# Patient Record
Sex: Female | Born: 1980 | Race: Black or African American | Hispanic: No | Marital: Married | Smoking: Never smoker
Health system: Southern US, Community
[De-identification: ages and names within clinical notes are randomized; demographics above are authoritative.]

## PROBLEM LIST (undated history)

## (undated) DIAGNOSIS — F419 Anxiety disorder, unspecified: Secondary | ICD-10-CM

## (undated) DIAGNOSIS — E039 Hypothyroidism, unspecified: Secondary | ICD-10-CM

## (undated) DIAGNOSIS — N840 Polyp of corpus uteri: Secondary | ICD-10-CM

## (undated) DIAGNOSIS — M1990 Unspecified osteoarthritis, unspecified site: Secondary | ICD-10-CM

## (undated) DIAGNOSIS — R011 Cardiac murmur, unspecified: Secondary | ICD-10-CM

## (undated) DIAGNOSIS — R51 Headache: Secondary | ICD-10-CM

## (undated) DIAGNOSIS — K8020 Calculus of gallbladder without cholecystitis without obstruction: Secondary | ICD-10-CM

## (undated) DIAGNOSIS — E119 Type 2 diabetes mellitus without complications: Secondary | ICD-10-CM

## (undated) DIAGNOSIS — R519 Headache, unspecified: Secondary | ICD-10-CM

## (undated) HISTORY — DX: Unspecified osteoarthritis, unspecified site: M19.90

## (undated) HISTORY — DX: Type 2 diabetes mellitus without complications: E11.9

## (undated) HISTORY — DX: Anxiety disorder, unspecified: F41.9

## (undated) HISTORY — DX: Headache, unspecified: R51.9

## (undated) HISTORY — DX: Calculus of gallbladder without cholecystitis without obstruction: K80.20

## (undated) HISTORY — DX: Headache: R51

## (undated) HISTORY — PX: POLYPECTOMY: SHX149

---

## 2003-11-24 ENCOUNTER — Other Ambulatory Visit: Admission: RE | Admit: 2003-11-24 | Discharge: 2003-11-24 | Payer: Self-pay | Admitting: Obstetrics and Gynecology

## 2004-02-22 HISTORY — PX: WISDOM TOOTH EXTRACTION: SHX21

## 2005-05-03 ENCOUNTER — Other Ambulatory Visit: Admission: RE | Admit: 2005-05-03 | Discharge: 2005-05-03 | Payer: Self-pay | Admitting: Obstetrics and Gynecology

## 2008-11-14 ENCOUNTER — Encounter: Admission: RE | Admit: 2008-11-14 | Discharge: 2008-11-14 | Payer: Self-pay | Admitting: Endocrinology

## 2010-08-24 IMAGING — US US SOFT TISSUE HEAD/NECK
1 series · 14 of 25 positions shown · non-contrast
Comparison: None.

CLINICAL DATA: Hypothyroidism for which the patient is undergoing
medical therapy.  Possible palpable nodule on physical examination.

THYROID ULTRASOUND 11/14/2008:
TECHNIQUE: Ultrasound examination of the thyroid gland and
adjacent soft tissues was performed.

[Series 1: us soft tissue head/neck · 0.08mm/px · 14 of 42 slices shown]
[im 1/42]
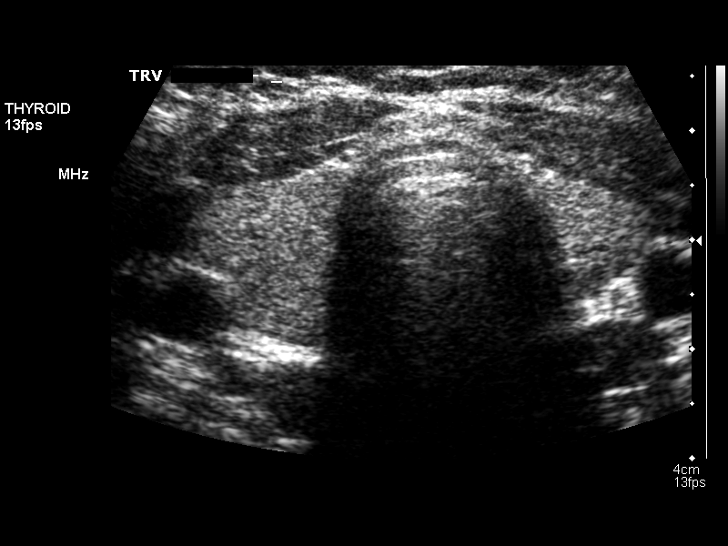
[im 4/42]
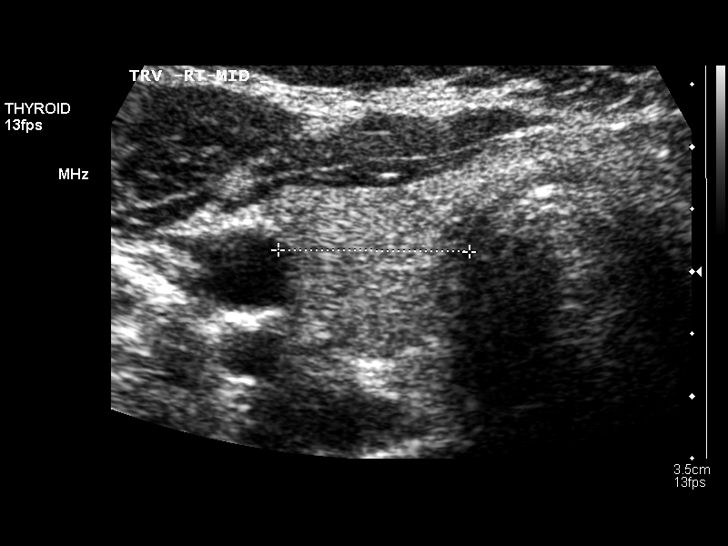
[im 7/42]
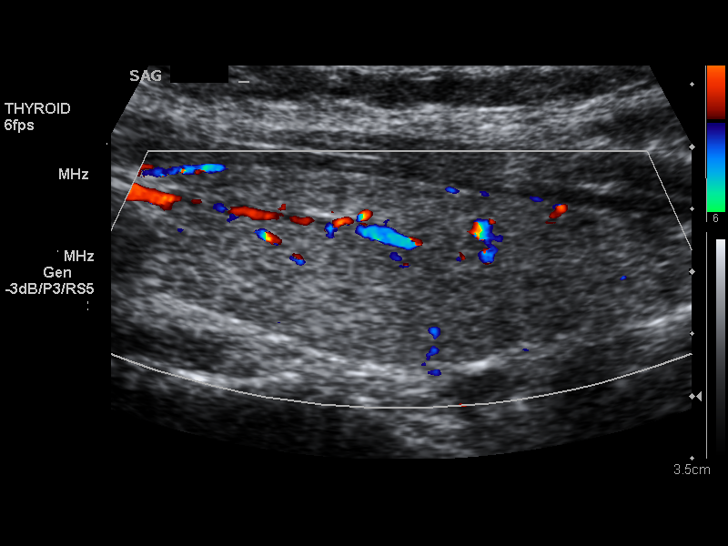
[im 11/42]
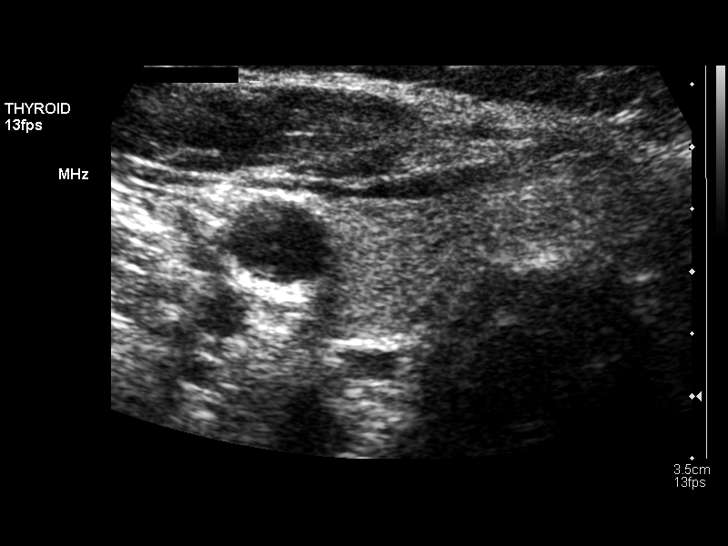
[im 14/42]
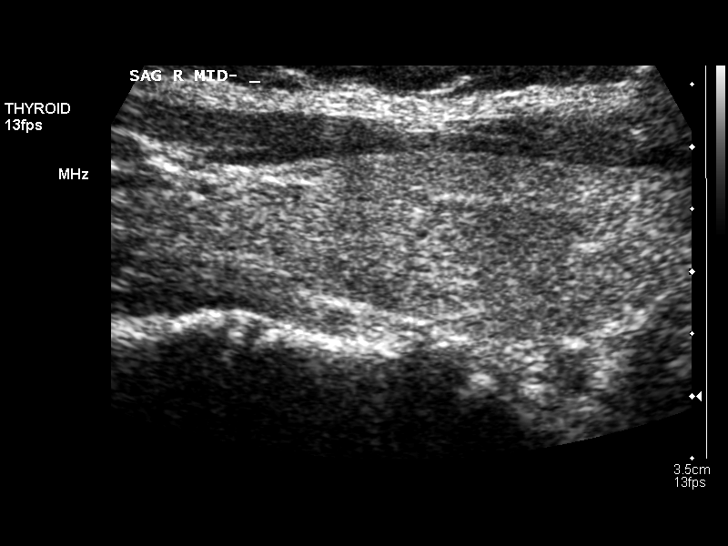
[im 16/42]
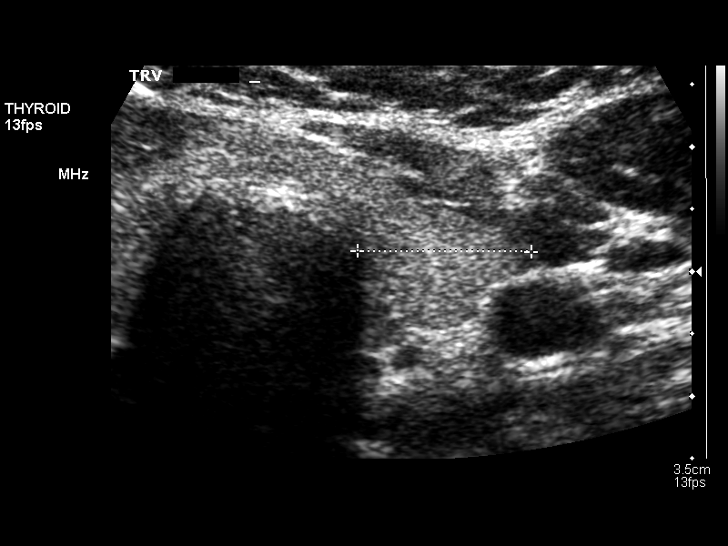
[im 19/42]
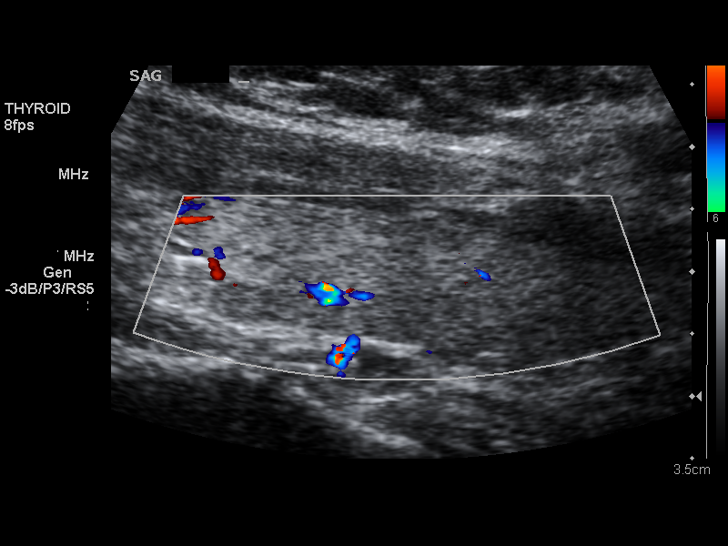
[im 23/42]
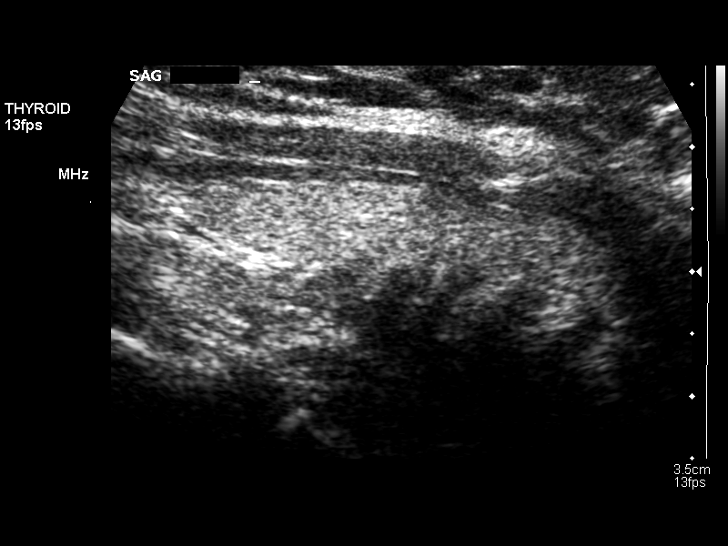
[im 26/42]
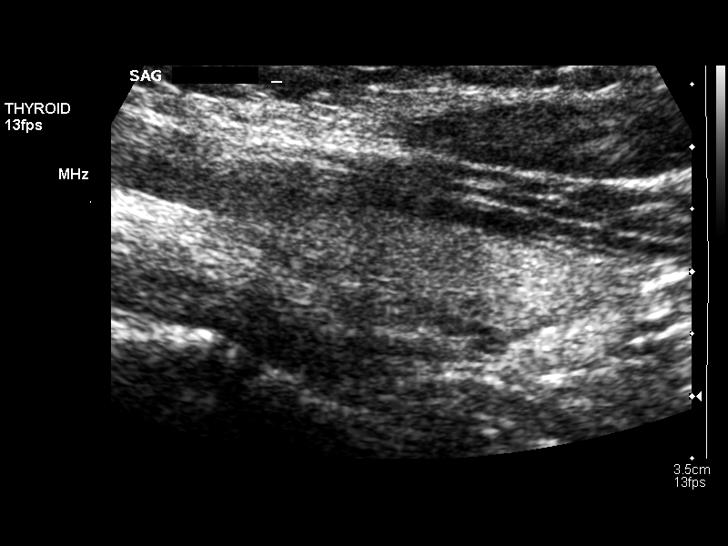
[im 28/42]
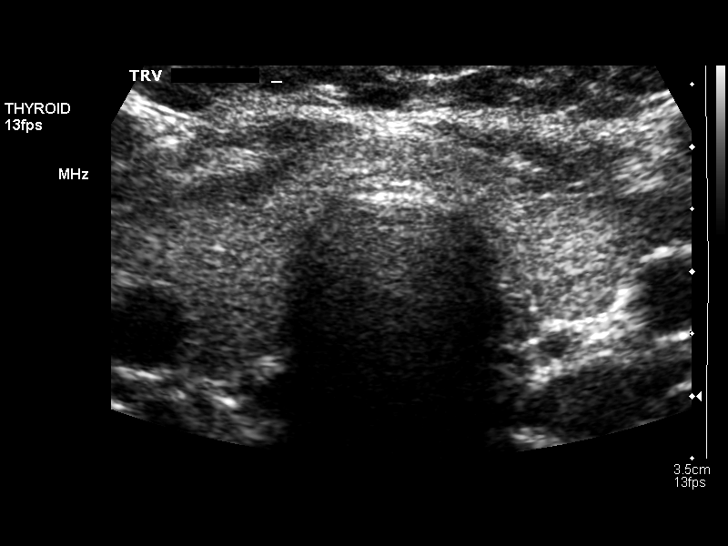
[im 31/42]
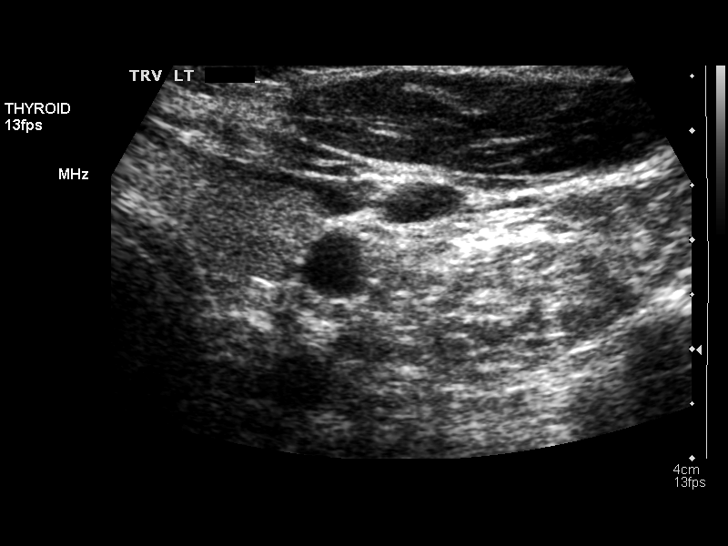
[im 35/42]
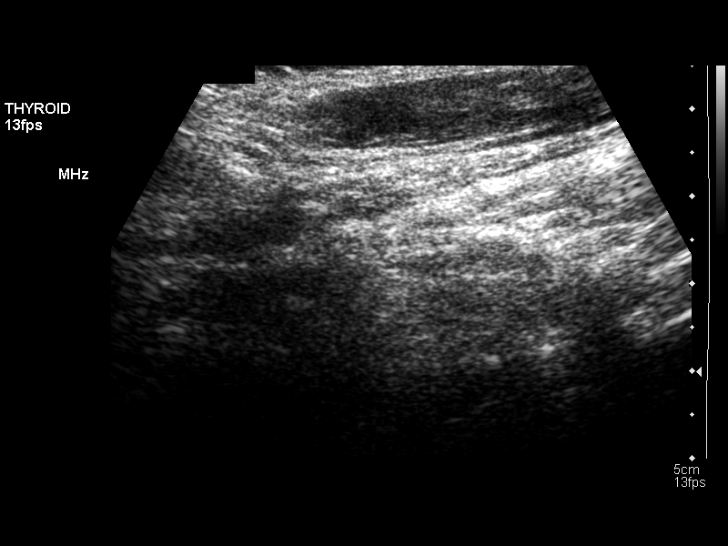
[im 38/42]
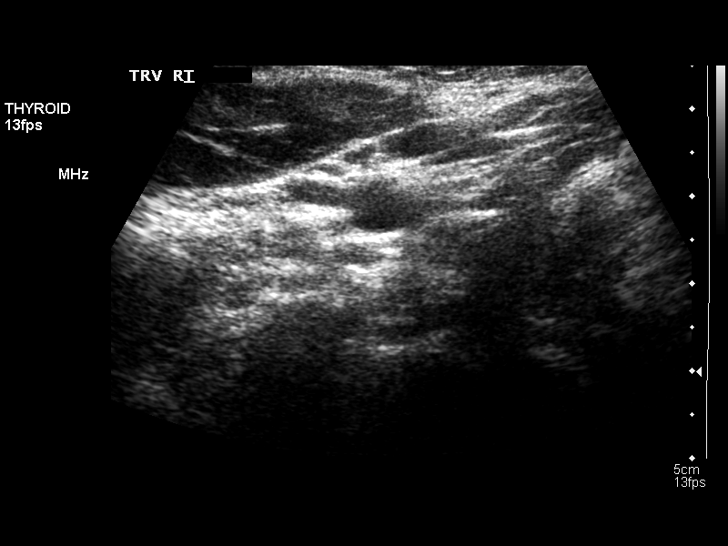
[im 42/42]
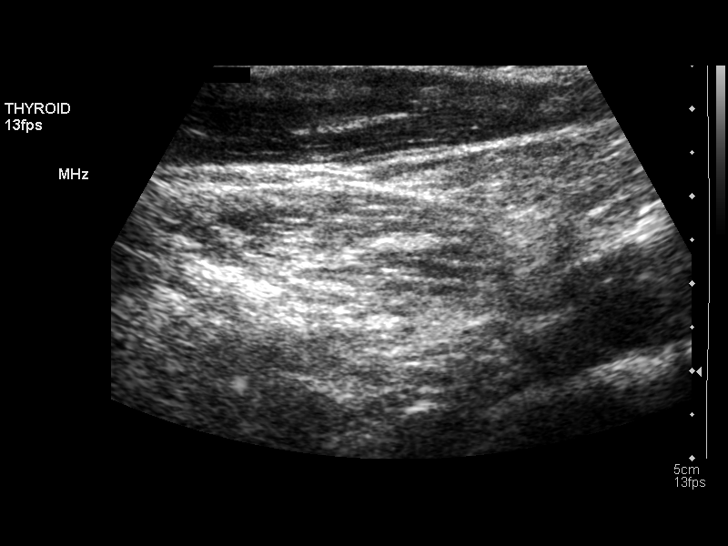

[14 of 25 positions shown; findings below may reference images not displayed]

FINDINGS: Asymmetric slight enlargement of the right lobe relative
to the left lobe.  The right lobe measures approximately 5.7 x
x 1.5 cm and the left lobe measures approximately 4.4 x 1.1 x
cm.  Slight enlargement of the isthmus up to 0.5 cm.  Heterogeneous
thyroid echotexture diffusely.  No cystic or solid nodules in the
thyroid gland.  No visible adjacent lymphadenopathy.
IMPRESSION: Asymmetric enlargement the right lobe of the thyroid gland,
measurements given above.  Heterogeneous thyroid echotexture
without evidence of cystic or solid nodules.

## 2014-04-28 ENCOUNTER — Encounter (HOSPITAL_BASED_OUTPATIENT_CLINIC_OR_DEPARTMENT_OTHER): Payer: Self-pay | Admitting: *Deleted

## 2014-04-28 NOTE — Progress Notes (Signed)
NPO AFTER MN WITH EXCEPTION CLEAR LIQUIDS UNTIL 0700 (NO CREAM/ MILK PRODUCTS).  ARRIVE AT 1130. NEEDS HG AND URINE PREG.  PRE-OP ORDERS PENDING.

## 2014-04-29 NOTE — H&P (Signed)
34 year old G 0 with 2.1 cm endometrial polyp. Last period lasted for one month.  Past Medical History  Diagnosis Date  . Endometrial polyp   . Heart murmur     benign, asymptomatic  . Hypothyroidism    Past Surgical History  Procedure Laterality Date  . Wisdom tooth extraction  2006   Review of patient's allergies indicates no known allergies. No results found for this or any previous visit (from the past 24 hour(s)). History   Social History  . Marital Status: Single    Spouse Name: N/A  . Number of Children: N/A  . Years of Education: N/A   Social History Main Topics  . Smoking status: Never Smoker   . Smokeless tobacco: Never Used  . Alcohol Use: Yes     Comment: occasional  . Drug Use: No  . Sexual Activity: Not on file   Other Topics Concern  . None   Social History Narrative  . None   History reviewed. No pertinent family history. General alert and oriented Lung CTAB Car RRR Abdomen is soft and non tender Pelvic above  IMPRESSION: Endometrial polyp Menorrhagia  PLAN: D and C, Hysteroscopy Risks reviewed  Consent signed

## 2014-04-30 ENCOUNTER — Ambulatory Visit (HOSPITAL_BASED_OUTPATIENT_CLINIC_OR_DEPARTMENT_OTHER): Payer: 59 | Admitting: Anesthesiology

## 2014-04-30 ENCOUNTER — Ambulatory Visit (HOSPITAL_BASED_OUTPATIENT_CLINIC_OR_DEPARTMENT_OTHER)
Admission: RE | Admit: 2014-04-30 | Discharge: 2014-04-30 | Disposition: A | Discharge status: Home / Self Care | Payer: 59 | Source: Ambulatory Visit | Attending: Obstetrics and Gynecology | Admitting: Obstetrics and Gynecology

## 2014-04-30 ENCOUNTER — Encounter (HOSPITAL_BASED_OUTPATIENT_CLINIC_OR_DEPARTMENT_OTHER): Payer: Self-pay | Admitting: Anesthesiology

## 2014-04-30 ENCOUNTER — Encounter (HOSPITAL_BASED_OUTPATIENT_CLINIC_OR_DEPARTMENT_OTHER)
Admission: RE | Disposition: A | Discharge status: Home / Self Care | Payer: Self-pay | Source: Ambulatory Visit | Attending: Obstetrics and Gynecology

## 2014-04-30 DIAGNOSIS — N920 Excessive and frequent menstruation with regular cycle: Secondary | ICD-10-CM | POA: Diagnosis present

## 2014-04-30 DIAGNOSIS — D250 Submucous leiomyoma of uterus: Secondary | ICD-10-CM | POA: Diagnosis not present

## 2014-04-30 DIAGNOSIS — N840 Polyp of corpus uteri: Secondary | ICD-10-CM | POA: Diagnosis present

## 2014-04-30 DIAGNOSIS — E039 Hypothyroidism, unspecified: Secondary | ICD-10-CM | POA: Diagnosis not present

## 2014-04-30 DIAGNOSIS — Z6841 Body Mass Index (BMI) 40.0 and over, adult: Secondary | ICD-10-CM | POA: Insufficient documentation

## 2014-04-30 HISTORY — PX: HYSTEROSCOPY WITH D & C: SHX1775

## 2014-04-30 HISTORY — DX: Hypothyroidism, unspecified: E03.9

## 2014-04-30 HISTORY — DX: Polyp of corpus uteri: N84.0

## 2014-04-30 HISTORY — DX: Cardiac murmur, unspecified: R01.1

## 2014-04-30 LAB — CBC
HCT: 40.1 % (ref 36.0–46.0)
Hemoglobin: 13.4 g/dL (ref 12.0–15.0)
MCH: 28.6 pg (ref 26.0–34.0)
MCHC: 33.4 g/dL (ref 30.0–36.0)
MCV: 85.5 fL (ref 78.0–100.0)
Platelets: 418 10*3/uL — ABNORMAL HIGH (ref 150–400)
RBC: 4.69 MIL/uL (ref 3.87–5.11)
RDW: 12.7 % (ref 11.5–15.5)
WBC: 11.3 10*3/uL — AB (ref 4.0–10.5)

## 2014-04-30 LAB — POCT PREGNANCY, URINE: PREG TEST UR: NEGATIVE

## 2014-04-30 SURGERY — DILATATION AND CURETTAGE /HYSTEROSCOPY
Anesthesia: General | Site: Uterus

## 2014-04-30 MED ORDER — GLYCINE 1.5 % IR SOLN
Status: DC | PRN
  Administered 2014-04-30: 3000 mL

## 2014-04-30 MED ORDER — FENTANYL CITRATE 0.05 MG/ML IJ SOLN
INTRAMUSCULAR | Status: AC
  Filled 2014-04-30: qty 4

## 2014-04-30 MED ORDER — KETOROLAC TROMETHAMINE 30 MG/ML IJ SOLN
INTRAMUSCULAR | Status: DC | PRN
  Administered 2014-04-30: 30 mg via INTRAVENOUS

## 2014-04-30 MED ORDER — DEXAMETHASONE SODIUM PHOSPHATE 4 MG/ML IJ SOLN
INTRAMUSCULAR | Status: DC | PRN
  Administered 2014-04-30: 10 mg via INTRAVENOUS

## 2014-04-30 MED ORDER — FENTANYL CITRATE 0.05 MG/ML IJ SOLN
25.0000 ug | INTRAMUSCULAR | Status: DC | PRN
Start: 2014-04-30 — End: 2014-04-30
  Filled 2014-04-30: qty 1

## 2014-04-30 MED ORDER — LIDOCAINE HCL (CARDIAC) 20 MG/ML IV SOLN
INTRAVENOUS | Status: DC | PRN
  Administered 2014-04-30: 100 mg via INTRAVENOUS

## 2014-04-30 MED ORDER — PROPOFOL 10 MG/ML IV BOLUS
INTRAVENOUS | Status: DC | PRN
  Administered 2014-04-30: 200 mg via INTRAVENOUS

## 2014-04-30 MED ORDER — LACTATED RINGERS IV SOLN
INTRAVENOUS | Status: DC
  Filled 2014-04-30: qty 1000

## 2014-04-30 MED ORDER — AMPICILLIN-SULBACTAM SODIUM 3 (2-1) G IJ SOLR
INTRAMUSCULAR | Status: AC
  Filled 2014-04-30: qty 3

## 2014-04-30 MED ORDER — SODIUM CHLORIDE 0.9 % IV SOLN
3.0000 g | INTRAVENOUS | Status: AC
  Administered 2014-04-30: 3 g via INTRAVENOUS
  Filled 2014-04-30: qty 3

## 2014-04-30 MED ORDER — MIDAZOLAM HCL 2 MG/2ML IJ SOLN
INTRAMUSCULAR | Status: AC
  Filled 2014-04-30: qty 2

## 2014-04-30 MED ORDER — ACETAMINOPHEN 10 MG/ML IV SOLN
INTRAVENOUS | Status: DC | PRN
  Administered 2014-04-30: 1000 mg via INTRAVENOUS

## 2014-04-30 MED ORDER — ONDANSETRON HCL 4 MG/2ML IJ SOLN
INTRAMUSCULAR | Status: DC | PRN
  Administered 2014-04-30: 4 mg via INTRAVENOUS

## 2014-04-30 MED ORDER — MIDAZOLAM HCL 5 MG/5ML IJ SOLN
INTRAMUSCULAR | Status: DC | PRN
  Administered 2014-04-30: 2 mg via INTRAVENOUS

## 2014-04-30 MED ORDER — METOCLOPRAMIDE HCL 5 MG/ML IJ SOLN
INTRAMUSCULAR | Status: DC | PRN
  Administered 2014-04-30: 10 mg via INTRAVENOUS

## 2014-04-30 MED ORDER — PROMETHAZINE HCL 25 MG/ML IJ SOLN
6.2500 mg | INTRAMUSCULAR | Status: DC | PRN
  Filled 2014-04-30: qty 1

## 2014-04-30 MED ORDER — FENTANYL CITRATE 0.05 MG/ML IJ SOLN
INTRAMUSCULAR | Status: DC | PRN
  Administered 2014-04-30: 50 ug via INTRAVENOUS
  Administered 2014-04-30 (×2): 25 ug via INTRAVENOUS

## 2014-04-30 MED ORDER — LACTATED RINGERS IV SOLN
INTRAVENOUS | Status: DC
  Administered 2014-04-30: 13:00:00 via INTRAVENOUS
  Filled 2014-04-30: qty 1000

## 2014-04-30 SURGICAL SUPPLY — 24 items
CANISTER SUCTION 2500CC (MISCELLANEOUS) ×3 IMPLANT
CATH ROBINSON RED A/P 16FR (CATHETERS) ×3 IMPLANT
CLOTH BEACON ORANGE TIMEOUT ST (SAFETY) ×3 IMPLANT
COVER TABLE BACK 60X90 (DRAPES) ×3 IMPLANT
DRAPE LG THREE QUARTER DISP (DRAPES) ×3 IMPLANT
DRSG TELFA 3X8 NADH (GAUZE/BANDAGES/DRESSINGS) ×3 IMPLANT
ELECT REM PT RETURN 9FT ADLT (ELECTROSURGICAL)
ELECTRODE REM PT RTRN 9FT ADLT (ELECTROSURGICAL) IMPLANT
GLOVE BIO SURGEON STRL SZ 6.5 (GLOVE) ×4 IMPLANT
GLOVE BIO SURGEONS STRL SZ 6.5 (GLOVE) ×2
GOWN PREVENTION PLUS LG XLONG (DISPOSABLE) ×1 IMPLANT
GOWN STRL REIN XL XLG (GOWN DISPOSABLE) ×1 IMPLANT
GOWN STRL REUS W/ TWL LRG LVL3 (GOWN DISPOSABLE) IMPLANT
GOWN STRL REUS W/TWL LRG LVL3 (GOWN DISPOSABLE) ×6
LEGGING LITHOTOMY PAIR STRL (DRAPES) ×3 IMPLANT
LOOP ANGLED CUTTING 22FR (CUTTING LOOP) IMPLANT
NDL SPNL 25GX3.5 QUINCKE BL (NEEDLE) ×1 IMPLANT
NEEDLE SPNL 25GX3.5 QUINCKE BL (NEEDLE) ×3 IMPLANT
PAD DRESSING TELFA 3X8 NADH (GAUZE/BANDAGES/DRESSINGS) ×1 IMPLANT
SYR CONTROL 10ML LL (SYRINGE) ×3 IMPLANT
TOWEL OR 17X24 6PK STRL BLUE (TOWEL DISPOSABLE) ×6 IMPLANT
TRAY DSU PREP LF (CUSTOM PROCEDURE TRAY) ×3 IMPLANT
TUBE HYSTEROSCOPY W Y-CONNECT (TUBING) ×3 IMPLANT
WATER STERILE IRR 500ML POUR (IV SOLUTION) ×3 IMPLANT

## 2014-04-30 NOTE — Progress Notes (Signed)
H and P on the chart No significant changes Will proceed with D and C, Hysteroscopy removal of polyp Consent signed

## 2014-04-30 NOTE — Anesthesia Preprocedure Evaluation (Addendum)
Anesthesia Evaluation  Patient identified by MRN, date of birth, ID band Patient awake    Reviewed: Allergy & Precautions, NPO status , Patient's Chart, lab work & pertinent test results  Airway Mallampati: II  TM Distance: >3 FB Neck ROM: Full    Dental no notable dental hx. (+) Teeth Intact, Dental Advisory Given   Pulmonary neg pulmonary ROS,  breath sounds clear to auscultation  Pulmonary exam normal       Cardiovascular negative cardio ROS  + Valvular Problems/Murmurs Rhythm:Regular Rate:Normal     Neuro/Psych negative neurological ROS  negative psych ROS   GI/Hepatic negative GI ROS, Neg liver ROS,   Endo/Other  Hypothyroidism Morbid obesity  Renal/GU negative Renal ROS  negative genitourinary   Musculoskeletal negative musculoskeletal ROS (+)   Abdominal (+) + obese,   Peds negative pediatric ROS (+)  Hematology negative hematology ROS (+)   Anesthesia Other Findings   Reproductive/Obstetrics negative OB ROS                            Anesthesia Physical Anesthesia Plan  ASA: III  Anesthesia Plan: General   Post-op Pain Management:    Induction: Intravenous  Airway Management Planned: Oral ETT  Additional Equipment:   Intra-op Plan:   Post-operative Plan: Extubation in OR  Informed Consent: I have reviewed the patients History and Physical, chart, labs and discussed the procedure including the risks, benefits and alternatives for the proposed anesthesia with the patient or authorized representative who has indicated his/her understanding and acceptance.   Dental advisory given  Plan Discussed with: CRNA  Anesthesia Plan Comments:         Anesthesia Quick Evaluation

## 2014-04-30 NOTE — Discharge Instructions (Signed)
° °  D & C Home care Instructions:   Personal hygiene:  Used sanitary napkins for vaginal drainage not tampons. Shower or tub bathe the day after your procedure. No douching until bleeding stops. Always wipe from front to back after  Elimination.  Activity: Do not drive or operate any equipment today. The effects of the anesthesia are still present and drowsiness may result. Rest today, not necessarily flat bed rest, just take it easy. You may resume your normal activity in one to 2 days.  Sexual activity: No intercourse for one week or as indicated by your physician  Diet: Eat a light diet as desired this evening. You may resume a regular diet tomorrow.  Return to work: One to 2 days.  General Expectations of your surgery: Vaginal bleeding should be no heavier than a normal period. Spotting may continue up to 10 days. Mild cramps may continue for a couple of days. You may have a regular period in 2-6 weeks.  Unexpected observations call your doctor if these occur: persistent or heavy bleeding. Severe abdominal cramping or pain. Elevation of temperature greater than 100F.  Call for an appointment in one week.      Post Anesthesia Home Care Instructions  Activity: Get plenty of rest for the remainder of the day. A responsible adult should stay with you for 24 hours following the procedure.  For the next 24 hours, DO NOT: -Drive a car -Paediatric nurse -Drink alcoholic beverages -Take any medication unless instructed by your physician -Make any legal decisions or sign important papers.  Meals: Start with liquid foods such as gelatin or soup. Progress to regular foods as tolerated. Avoid greasy, spicy, heavy foods. If nausea and/or vomiting occur, drink only clear liquids until the nausea and/or vomiting subsides. Call your physician if vomiting continues.  Special Instructions/Symptoms: Your throat may feel dry or sore from the anesthesia or the breathing tube placed in your  throat during surgery. If this causes discomfort, gargle with warm salt water. The discomfort should disappear within 24 hours.

## 2014-04-30 NOTE — Brief Op Note (Signed)
04/30/2014  1:26 PM  PATIENT:  Rose Vargas  34 y.o. female  PRE-OPERATIVE DIAGNOSIS: Endometrial Polyp  POST-OPERATIVE DIAGNOSIS:  Same  PROCEDURE:  Procedure(s): DILATATION AND CURETTAGE /HYSTEROSCOPY (N/A)  Removal of endometrial Polyp  SURGEON:  Surgeon(s) and Role:    * Dian Queen, MD - Primary  PHYSICIAN ASSISTANT:   ASSISTANTS: none   ANESTHESIA:   paracervical block and MAC  EBL:  Total I/O In: 200 [I.V.:200] Out: 150 [Urine:150]  BLOOD ADMINISTERED:none  DRAINS: none   LOCAL MEDICATIONS USED:  LIDOCAINE   SPECIMEN:  Source of Specimen:  endometrial polyp and uterine currettings  DISPOSITION OF SPECIMEN:  PATHOLOGY  COUNTS:  YES  TOURNIQUET:  * No tourniquets in log *  DICTATION: .Other Dictation: Dictation Number [id number]  PLAN OF CARE: Discharge to home after PACU  PATIENT DISPOSITION:  PACU - hemodynamically stable.   Delay start of Pharmacological VTE agent (>24hrs) due to surgical blood loss or risk of bleeding: not applicable

## 2014-04-30 NOTE — Transfer of Care (Signed)
  Last Vitals:  Filed Vitals:   04/30/14 1149  BP: 151/74  Pulse: 86  Temp: 37.4 C  Resp: 16    Immediate Anesthesia Transfer of Care Note  Patient: Rose Vargas  Procedure(s) Performed: Procedure(s) (LRB): DILATATION AND CURETTAGE /HYSTEROSCOPY (N/A)  Patient Location: PACU  Anesthesia Type: General  Level of Consciousness: awake, alert  and oriented  Airway & Oxygen Therapy: Patient Spontanous Breathing and Patient connected to nasal cannula oxygen  Post-op Assessment: Report given to PACU RN and Post -op Vital signs reviewed and stable  Post vital signs: Reviewed and stable  Complications: No apparent anesthesia complications

## 2014-04-30 NOTE — Anesthesia Postprocedure Evaluation (Signed)
  Anesthesia Post-op Note  Patient: Rose Vargas  Procedure(s) Performed: Procedure(s) (LRB): DILATATION AND CURETTAGE /HYSTEROSCOPY (N/A)  Patient Location: PACU  Anesthesia Type: General  Level of Consciousness: awake and alert   Airway and Oxygen Therapy: Patient Spontanous Breathing  Post-op Pain: mild  Post-op Assessment: Post-op Vital signs reviewed, Patient's Cardiovascular Status Stable, Respiratory Function Stable, Patent Airway and No signs of Nausea or vomiting  Last Vitals:  Filed Vitals:   04/30/14 1445  BP: 123/58  Pulse: 82  Temp: 36.9 C  Resp: 16    Post-op Vital Signs: stable   Complications: No apparent anesthesia complications

## 2014-04-30 NOTE — Anesthesia Procedure Notes (Signed)
Procedure Name: LMA Insertion Date/Time: 04/30/2014 1:05 PM Performed by: Mechele Claude Pre-anesthesia Checklist: Patient identified, Emergency Drugs available, Suction available and Patient being monitored Patient Re-evaluated:Patient Re-evaluated prior to inductionOxygen Delivery Method: Circle System Utilized Preoxygenation: Pre-oxygenation with 100% oxygen Intubation Type: IV induction Ventilation: Mask ventilation without difficulty LMA: LMA with gastric port inserted LMA Size: 5.0 Number of attempts: 1 Placement Confirmation: positive ETCO2 Tube secured with: Tape Dental Injury: Teeth and Oropharynx as per pre-operative assessment

## 2014-05-01 ENCOUNTER — Encounter (HOSPITAL_BASED_OUTPATIENT_CLINIC_OR_DEPARTMENT_OTHER): Payer: Self-pay | Admitting: Obstetrics and Gynecology

## 2014-05-01 NOTE — Op Note (Signed)
NAMEMARGARITA, CROKE              ACCOUNT NO.:  [id number]  MEDICAL RECORD NO.:  [id number]  LOCATION:                                 FACILITY:  PHYSICIAN:  Khali Perella L. Rodnesha Elie, M.D.DATE OF BIRTH:  23-Dec-1980  DATE OF PROCEDURE:  04/30/2014 DATE OF DISCHARGE:  04/30/2014                              OPERATIVE REPORT   PREOPERATIVE DIAGNOSES:  Endometrial polyp and menorrhagia.  POSTOPERATIVE DIAGNOSES:  Endometrial polyp and menorrhagia.  PROCEDURE:  D and C, hysteroscopy, and removal of endometrial polyp.  SURGEON:  Nga Rabon L. Helane Rima, M.D.  ANESTHESIA:  LMA with paracervical block.  EBL:  Minimal.  COMPLICATIONS:  None.  PATHOLOGY:  Endometrial polyp with uterine curettings.  PROCEDURE:  The patient was taken to the operating room.  She was dosed and found to be adequate.  She was prepped and draped in usual sterile fashion.  Speculum was inserted into the vagina, the cervix was grasped with a tenaculum, and a paracervical block was performed in a standard fashion.  The diagnostic hysteroscope was inserted into the uterus with excellent visualization, I could see a small endometrial polyp that was consistent with the preop sonohysterogram.  The hysteroscope was removed and a sharp curette was inserted and the uterus was thoroughly curetted of all tissue.  Polyp forceps were inserted a few times and I was able to twist off the polyp and remove it intact.  The hysteroscope was reinserted, the uterine cavity was clean.  At the end of the procedure, minimal bleeding was noted.  The patient was given Toradol.  She went to recovery room in stable condition.     Moni Rothrock L. Helane Rima, M.D.     Nevin Bloodgood  D:  04/30/2014  T:  05/01/2014  Job:  [id number]

## 2014-10-31 ENCOUNTER — Other Ambulatory Visit: Payer: Self-pay | Admitting: Obstetrics and Gynecology

## 2014-11-03 LAB — CYTOLOGY - PAP

## 2015-12-02 ENCOUNTER — Ambulatory Visit (INDEPENDENT_AMBULATORY_CARE_PROVIDER_SITE_OTHER): Payer: 59 | Admitting: Neurology

## 2015-12-02 ENCOUNTER — Encounter: Payer: Self-pay | Admitting: Neurology

## 2015-12-02 DIAGNOSIS — G43709 Chronic migraine without aura, not intractable, without status migrainosus: Secondary | ICD-10-CM | POA: Insufficient documentation

## 2015-12-02 DIAGNOSIS — IMO0002 Reserved for concepts with insufficient information to code with codable children: Secondary | ICD-10-CM | POA: Insufficient documentation

## 2015-12-02 MED ORDER — SUMATRIPTAN SUCCINATE 50 MG PO TABS: 50.0000 mg | ORAL_TABLET | ORAL | 6 refills | Status: DC | PRN

## 2015-12-02 MED ORDER — TOPIRAMATE 100 MG PO TABS: 100.0000 mg | ORAL_TABLET | Freq: Two times a day (BID) | ORAL | 11 refills | Status: DC

## 2015-12-02 NOTE — Progress Notes (Signed)
PATIENT: Rose Vargas DOB: 02-Aug-1980  Chief Complaint  Patient presents with  . Headache    Reports worsening of headaches over the last year.  Estimates having three headache days per week.  Historically, OTC NSAIDS, heat and rest would resolve her pain but these treatments are no longer working.  At times, she has blurred vision and nausea with her headaches.     HISTORICAL  Rose Vargas is a 35 years old right-handed female, seen in refer by her primary care physician Dr. Dian Queen for evaluation of frequent headaches, initial evaluation was December 02 2015  She reported a history of migraine headaches since high school, difficulty at bilateral frontal temporal region, she has been taking ibuprofen, Excedrin Migraine which has been helpful, triggers for her headaches has strong smells, sinus congestion, menstruation,  Since February 2017, she has increased frequency of headaches, about 3 times each week, sometimes woke up with a severe lateralized severe pounding headaches, with associated light noise sensitivity, nauseous, sleep urinary helps her headache, also complains of a 10 pound weight gain, had recent optometry evaluation complains of blurry vision, which is corrected by her her first pair of glasses.  I reviewed the laboratory evaluation, normal cholesterol 137, LDL 54, A1c 5.1, vitamin D was mildly decreased 26,  REVIEW OF SYSTEMS: Full 14 system review of systems performed and notable only for headache, numbness, sleepiness, snoring, restless leg, anxiety, decreased energy, leaning for note, feeling hot, blurry vision, snoring, fatigue, more  ALLERGIES: No Known Allergies  HOME MEDICATIONS: Current Outpatient Prescriptions  Medication Sig Dispense Refill  . Cholecalciferol (VITAMIN D3) 1000 UNITS CAPS Take 1 capsule by mouth daily.    Marland Kitchen levothyroxine (SYNTHROID, LEVOTHROID) 100 MCG tablet Take 100 mcg by mouth daily before breakfast.    .  norgestimate-ethinyl estradiol (ORTHO-CYCLEN,SPRINTEC,PREVIFEM) 0.25-35 MG-MCG tablet Take 1 tablet by mouth every evening.     No current facility-administered medications for this visit.     PAST MEDICAL HISTORY: Past Medical History:  Diagnosis Date  . Endometrial polyp   . Headache   . Heart murmur    benign, asymptomatic  . Hypothyroidism     PAST SURGICAL HISTORY: Past Surgical History:  Procedure Laterality Date  . HYSTEROSCOPY W/D&C N/A 04/30/2014   Procedure: DILATATION AND CURETTAGE /HYSTEROSCOPY;  Surgeon: Dian Queen, MD;  Location: [facility];  Service: Gynecology;  Laterality: N/A;  . POLYPECTOMY    . WISDOM TOOTH EXTRACTION  2006    FAMILY HISTORY: Family History  Problem Relation Age of Onset  . Hyperthyroidism Mother   . Hypertension Mother   . Migraines Father     SOCIAL HISTORY:  Social History   Social History  . Marital status: Single    Spouse name: N/A  . Number of children: 0  . Years of education: Bachelors   Occupational History  . Customer Service     [employer]   Social History Main Topics  . Smoking status: Never Smoker  . Smokeless tobacco: Never Used  . Alcohol use Yes     Comment: occasional  . Drug use: No  . Sexual activity: Not on file   Other Topics Concern  . Not on file   Social History Narrative   Lives at home with sister.   Occasional caffeine use.   Right-handed.     PHYSICAL EXAM   Vitals:   12/02/15 0726  BP: (!) 143/88  Pulse: 82  Weight: 251 lb 4 oz (114 kg)  Height: 5' (1.524 m)    Not recorded      Body mass index is 49.07 kg/m.  PHYSICAL EXAMNIATION:  Gen: NAD, conversant, well nourised, obese, well groomed                     Cardiovascular: Regular rate rhythm, no peripheral edema, warm, nontender. Eyes: Conjunctivae clear without exudates or hemorrhage Neck: Supple, no carotid bruise. Pulmonary: Clear to auscultation bilaterally   NEUROLOGICAL EXAM:  MENTAL  STATUS: Speech:    Speech is normal; fluent and spontaneous with normal comprehension.  Cognition:     Orientation to time, place and person     Normal recent and remote memory     Normal Attention span and concentration     Normal Language, naming, repeating,spontaneous speech     Fund of knowledge   CRANIAL NERVES: CN II: Visual fields are full to confrontation. Fundoscopic exam is normal with sharp discs and no vascular changes. Pupils are round equal and briskly reactive to light. CN III, IV, VI: extraocular movement are normal. No ptosis. CN V: Facial sensation is intact to pinprick in all 3 divisions bilaterally. Corneal responses are intact.  CN VII: Face is symmetric with normal eye closure and smile. CN VIII: Hearing is normal to rubbing fingers CN IX, X: Palate elevates symmetrically. Phonation is normal. CN XI: Head turning and shoulder shrug are intact CN XII: Tongue is midline with normal movements and no atrophy.  MOTOR: There is no pronator drift of out-stretched arms. Muscle bulk and tone are normal. Muscle strength is normal.  REFLEXES: Reflexes are 2+ and symmetric at the biceps, triceps, knees, and ankles. Plantar responses are flexor.  SENSORY: Intact to light touch, pinprick, positional sensation and vibratory sensation are intact in fingers and toes.  COORDINATION: Rapid alternating movements and fine finger movements are intact. There is no dysmetria on finger-to-nose and heel-knee-shin.    GAIT/STANCE: Posture is normal. Gait is steady with normal steps, base, arm swing, and turning. Heel and toe walking are normal. Tandem gait is normal.  Romberg is absent.   DIAGNOSTIC DATA (LABS, IMAGING, TESTING) - I reviewed patient records, labs, notes, testing and imaging myself where available.   ASSESSMENT AND PLAN  Rose Vargas is a 35 y.o. female    Chronic migraine headache Add on preventive medications Topamax titrating to 100 mg twice a  day Imitrex as needed Return to clinic in 2 months with nurse practitioner  Marcial Pacas, M.D. Ph.D.  [facility] [address] [place], [place] [address] Ph: [phone] Fax: [phone]  JP:[id number] Helane Rima, MD

## 2016-01-21 ENCOUNTER — Other Ambulatory Visit: Payer: Self-pay | Admitting: *Deleted

## 2016-01-21 MED ORDER — TOPIRAMATE 100 MG PO TABS: 100.0000 mg | ORAL_TABLET | Freq: Two times a day (BID) | ORAL | 3 refills | Status: DC

## 2016-02-02 ENCOUNTER — Ambulatory Visit: Payer: 59 | Admitting: Adult Health

## 2016-03-09 ENCOUNTER — Ambulatory Visit: Payer: 59 | Admitting: Adult Health

## 2017-01-28 ENCOUNTER — Other Ambulatory Visit: Payer: Self-pay | Admitting: Neurology

## 2020-09-22 ENCOUNTER — Ambulatory Visit: Payer: 59 | Attending: Family

## 2020-09-22 DIAGNOSIS — Z23 Encounter for immunization: Secondary | ICD-10-CM

## 2020-09-22 NOTE — Progress Notes (Signed)
   Covid-19 Vaccination Clinic  Name:  Rose Vargas    MRN: [id number] DOB: [birth year]  09/22/2020  [name] was observed post Covid-19 immunization for 15 minutes without incident. She was provided with Vaccine Information Sheet and instruction to access the V-Safe system.   [name] was instructed to call 911 with any severe reactions post vaccine: Difficulty breathing  Swelling of face and throat  A fast heartbeat  A bad rash all over body  Dizziness and weakness   Immunizations Administered     Name Date Dose VIS Date [place] COVID-19 Vaccine 09/22/2020 11:00 AM 0.3 mL 12/11/2019 Intramuscular   Manufacturer: [place]   Lot: [id number]   NDC: [id number]

## 2021-01-21 DIAGNOSIS — R87619 Unspecified abnormal cytological findings in specimens from cervix uteri: Secondary | ICD-10-CM

## 2021-01-21 HISTORY — DX: Unspecified abnormal cytological findings in specimens from cervix uteri: R87.619

## 2022-05-31 ENCOUNTER — Other Ambulatory Visit: Payer: Self-pay | Admitting: Chiropractic Medicine

## 2022-05-31 ENCOUNTER — Ambulatory Visit
Admission: RE | Admit: 2022-05-31 | Discharge: 2022-05-31 | Disposition: A | Discharge status: Home / Self Care | Payer: BC Managed Care – PPO | Source: Ambulatory Visit | Attending: Chiropractic Medicine | Admitting: Chiropractic Medicine

## 2022-05-31 DIAGNOSIS — M542 Cervicalgia: Secondary | ICD-10-CM

## 2022-05-31 DIAGNOSIS — M549 Dorsalgia, unspecified: Secondary | ICD-10-CM

## 2022-05-31 DIAGNOSIS — M545 Low back pain, unspecified: Secondary | ICD-10-CM

## 2022-05-31 DIAGNOSIS — M5440 Lumbago with sciatica, unspecified side: Secondary | ICD-10-CM

## 2022-06-21 ENCOUNTER — Other Ambulatory Visit: Payer: Self-pay | Admitting: Chiropractic Medicine

## 2022-06-21 DIAGNOSIS — M545 Low back pain, unspecified: Secondary | ICD-10-CM

## 2022-06-29 ENCOUNTER — Encounter: Payer: Self-pay | Admitting: Chiropractic Medicine

## 2022-07-04 ENCOUNTER — Ambulatory Visit
Admission: RE | Admit: 2022-07-04 | Discharge: 2022-07-04 | Disposition: A | Discharge status: Home / Self Care | Payer: BC Managed Care – PPO | Source: Ambulatory Visit | Attending: Chiropractic Medicine | Admitting: Chiropractic Medicine

## 2022-07-04 DIAGNOSIS — M545 Low back pain, unspecified: Secondary | ICD-10-CM

## 2022-07-04 MED ORDER — IOPAMIDOL (ISOVUE-300) INJECTION 61%
100.0000 mL | Freq: Once | INTRAVENOUS | Status: AC | PRN
  Administered 2022-07-04: 100 mL via INTRAVENOUS

## 2022-08-31 ENCOUNTER — Encounter: Payer: Self-pay | Admitting: Gastroenterology

## 2022-09-06 ENCOUNTER — Ambulatory Visit: Payer: BC Managed Care – PPO | Admitting: Gastroenterology

## 2022-09-06 NOTE — Progress Notes (Deleted)
   HPI :      CT abdomen/ pelvis 07/05/22: IMPRESSION: 1. Bilateral renal stones. 2. Steatotic enlarged liver. 3. Cholelithiasis.      Past Medical History:  Diagnosis Date   Abnormal Pap smear of cervix 01/2021   Endometrial polyp    Headache    Heart murmur    benign, asymptomatic   Hypothyroidism      Past Surgical History:  Procedure Laterality Date   HYSTEROSCOPY WITH D & C N/A 04/30/2014   Procedure: DILATATION AND CURETTAGE /HYSTEROSCOPY;  Surgeon: Marcelle Overlie, MD;  Location: [facility];  Service: Gynecology;  Laterality: N/A;   POLYPECTOMY     WISDOM TOOTH EXTRACTION  2006   Family History  Problem Relation Age of Onset   Hyperthyroidism Mother    Hypertension Mother    Arthritis Mother    Anemia Mother    Asthma Mother    Migraines Father    Hypercholesterolemia Father    Hypertension Father    Cancer - Colon Maternal Grandfather 46   Colon cancer Paternal Grandfather    Social History   Tobacco Use   Smoking status: Never   Smokeless tobacco: Never  Substance Use Topics   Alcohol use: Yes    Comment: occasional   Drug use: No   Current Outpatient Medications  Medication Sig Dispense Refill   Cholecalciferol (VITAMIN D3) 1000 UNITS CAPS Take 1 capsule by mouth daily.     levothyroxine (SYNTHROID, LEVOTHROID) 100 MCG tablet Take 100 mcg by mouth daily before breakfast.     norgestimate-ethinyl estradiol (ORTHO-CYCLEN,SPRINTEC,PREVIFEM) 0.25-35 MG-MCG tablet Take 1 tablet by mouth every evening.     SUMAtriptan (IMITREX) 50 MG tablet Take 1 tablet (50 mg total) by mouth every 2 (two) hours as needed for migraine. May repeat in 2 hours if headache persists or recurs. 15 tablet 6   topiramate (TOPAMAX) 100 MG tablet Take 1 tablet (100 mg total) by mouth 2 (two) times daily. 180 tablet 3   No current facility-administered medications for this visit.   No Known Allergies   Review of Systems: All systems reviewed and negative  except where noted in HPI.    No results found.  Physical Exam: There were no vitals taken for this visit. Constitutional: Pleasant,well-developed, ***female in no acute distress. HEENT: Normocephalic and atraumatic. Conjunctivae are normal. No scleral icterus. Neck supple.  Cardiovascular: Normal rate, regular rhythm.  Pulmonary/chest: Effort normal and breath sounds normal. No wheezing, rales or rhonchi. Abdominal: Soft, nondistended, nontender. Bowel sounds active throughout. There are no masses palpable. No hepatomegaly. Extremities: no edema Lymphadenopathy: No cervical adenopathy noted. Neurological: Alert and oriented to person place and time. Skin: Skin is warm and dry. No rashes noted. Psychiatric: Normal mood and affect. Behavior is normal.   ASSESSMENT: 42 y.o. female here for assessment of the following  No diagnosis found.  PLAN:   Marcelle Overlie, MD

## 2022-10-03 ENCOUNTER — Other Ambulatory Visit (INDEPENDENT_AMBULATORY_CARE_PROVIDER_SITE_OTHER): Payer: BC Managed Care – PPO

## 2022-10-03 ENCOUNTER — Ambulatory Visit: Payer: BC Managed Care – PPO | Admitting: Gastroenterology

## 2022-10-03 ENCOUNTER — Encounter: Payer: Self-pay | Admitting: Gastroenterology

## 2022-10-03 VITALS — BP 116/68 | HR 88 | Ht 60.0 in | Wt 253.1 lb

## 2022-10-03 DIAGNOSIS — K760 Fatty (change of) liver, not elsewhere classified: Secondary | ICD-10-CM | POA: Diagnosis not present

## 2022-10-03 DIAGNOSIS — R140 Abdominal distension (gaseous): Secondary | ICD-10-CM | POA: Diagnosis not present

## 2022-10-03 DIAGNOSIS — K8020 Calculus of gallbladder without cholecystitis without obstruction: Secondary | ICD-10-CM

## 2022-10-03 LAB — COMPREHENSIVE METABOLIC PANEL
ALT: 33 U/L (ref 0–35)
AST: 30 U/L (ref 0–37)
Albumin: 4.4 g/dL (ref 3.5–5.2)
Alkaline Phosphatase: 79 U/L (ref 39–117)
BUN: 7 mg/dL (ref 6–23)
CO2: 26 mEq/L (ref 19–32)
Calcium: 9.4 mg/dL (ref 8.4–10.5)
Chloride: 101 mEq/L (ref 96–112)
Creatinine, Ser: 0.64 mg/dL (ref 0.40–1.20)
GFR: 108.97 mL/min (ref >60.00)
Glucose, Bld: 97 mg/dL (ref 70–99)
Potassium: 3.8 mEq/L (ref 3.5–5.1)
Sodium: 135 mEq/L (ref 135–145)
Total Bilirubin: 1.1 mg/dL (ref 0.2–1.2)
Total Protein: 7.7 g/dL (ref 6.0–8.3)

## 2022-10-03 LAB — CBC
HCT: 39 % (ref 36.0–46.0)
Hemoglobin: 12.8 g/dL (ref 12.0–15.0)
MCHC: 32.9 g/dL (ref 30.0–36.0)
MCV: 85.7 fl (ref 78.0–100.0)
Platelets: 380 10*3/uL (ref 150.0–400.0)
RBC: 4.54 Mil/uL (ref 3.87–5.11)
RDW: 12.8 % (ref 11.5–15.5)
WBC: 10.4 10*3/uL (ref 4.0–10.5)

## 2022-10-03 LAB — PROTIME-INR
INR: 1.1 ratio — ABNORMAL HIGH (ref 0.8–1.0)
Prothrombin Time: 11.7 s (ref 9.6–13.1)

## 2022-10-03 NOTE — Progress Notes (Unsigned)
Chief Complaint: Cholelithiasis   Referring Provider:     Marcelle Overlie, MD   HPI:     Rose Vargas is a 42 y.o. female with a history of diabetes, arthritis, hypothyroidism, hysterectomy, obesity (BMI 49.4), referred to the Gastroenterology Clinic for evaluation of recent abnormal CT.  -07/04/2022: CT A/P: Hepatomegaly at 19.8 cm, cholelithiasis.  No duct dilatation.  Hepatic steatosis.  Otherwise normal GI tract, pancreas, spleen.  Bilateral renal stones.  CT was ordered by her chiropractor for evaluation of abnormal spine x-ray.   Does have bloating and increased gas production, but has been ongoing for several years and attributed to mostly sedentary at home with her work. Tends to be better when more active. Otherwise, no GI sxs.   Was recently started on Metformin by Endocrinologist for recently diagnosed DM (was previously IFG).    Joined a gym and planning to start swimming for exercise.    No known family history of CRC, GI malignancy, liver disease, pancreatic disease, or IBD.     Past Medical History:  Diagnosis Date   Abnormal Pap smear of cervix 01/2021   Anxiety    Arthritis    DM (diabetes mellitus) (HCC)    Endometrial polyp    Gallstones    Headache    Heart murmur    benign, asymptomatic   Hypothyroidism      Past Surgical History:  Procedure Laterality Date   HYSTEROSCOPY WITH D & C N/A 04/30/2014   Procedure: DILATATION AND CURETTAGE /HYSTEROSCOPY;  Surgeon: Marcelle Overlie, MD;  Location: [facility];  Service: Gynecology;  Laterality: N/A;   POLYPECTOMY     WISDOM TOOTH EXTRACTION  2006   Family History  Problem Relation Age of Onset   Hyperthyroidism Mother    Hypertension Mother    Rheum arthritis Mother    Anemia Mother    Asthma Mother    Migraines Father    Hypercholesterolemia Father    Hypertension Father    Other Father        ? stroke or heart attack   Stomach cancer Maternal Grandmother     Cancer - Colon Maternal Grandfather 79   Prostate cancer Paternal Grandfather    Arthritis Brother    Social History   Tobacco Use   Smoking status: Never   Smokeless tobacco: Never  Vaping Use   Vaping status: Never Used  Substance Use Topics   Alcohol use: Yes    Comment: social-1 per week   Drug use: No   Current Outpatient Medications  Medication Sig Dispense Refill   Cholecalciferol (VITAMIN D3) 1000 UNITS CAPS Take 1 capsule by mouth daily.     metFORMIN (GLUCOPHAGE-XR) 500 MG 24 hr tablet Take 500 mg by mouth daily.     Multiple Vitamin (MULTIVITAMIN) tablet Take 1 tablet by mouth daily.     SYNTHROID 75 MCG tablet Take 75 mcg by mouth every morning.     No current facility-administered medications for this visit.   Allergies  Allergen Reactions   Bee Pollen Itching    Other Reaction(s): eye swelling   Short Ragweed Pollen Ext Itching    Other Reaction(s): eye redness     Review of Systems: All systems reviewed and negative except where noted in HPI.     Physical Exam:    Wt Readings from Last 3 Encounters:  10/03/22 253 lb 2 oz (114.8 kg)  12/02/15 251 lb  4 oz (114 kg)  04/30/14 245 lb 8 oz (111.4 kg)    BP 116/68 (BP Location: Left Arm, Patient Position: Sitting, Cuff Size: Large)   Pulse 88   Ht 5' (1.524 m)   Wt 253 lb 2 oz (114.8 kg)   LMP 10/02/2022   BMI 49.44 kg/m  Constitutional:  Pleasant, in no acute distress. Psychiatric: Normal mood and affect. Behavior is normal. EENT: Pupils normal.  Conjunctivae are normal. No scleral icterus. Neck supple. No cervical LAD. Cardiovascular: Normal rate, regular rhythm. No edema Pulmonary/chest: Effort normal and breath sounds normal. No wheezing, rales or rhonchi. Abdominal: Soft, nondistended, nontender. Bowel sounds active throughout. There are no masses palpable. No hepatomegaly. Neurological: Alert and oriented to person place and time. Skin: Skin is warm and dry. No rashes noted.   ASSESSMENT  AND PLAN;   1)  - LAEs - GLP-1? - Check CBC, CMP, and PT/INR - RTC in 6 months. Plan for [place] elast then - Diet, exercise   - [facility] referral- dclines   RTC in 6 months or sooner prn   Shellia Cleverly, DO, FACG  10/03/2022, 2:35 PM   Marcelle Overlie, MD

## 2022-10-03 NOTE — Patient Instructions (Addendum)
Your provider has requested that you go to the basement level for lab work before leaving today. Press "B" on the elevator. The lab is located at the first door on the left as you exit the elevator.   Please follow up in 6 months. Give [place] a call at [phone] to schedule an appointment.   Due to recent changes in healthcare laws, you may see the results of your imaging and laboratory studies on MyChart before your provider has had a chance to review them.  We understand that in some cases there may be results that are confusing or concerning to you. Not all laboratory results come back in the same time frame and the provider may be waiting for multiple results in order to interpret others.  Please give [place] 48 hours in order for your provider to thoroughly review all the results before contacting the office for clarification of your results.    _______________________________________________________  If your blood pressure at your visit was 140/90 or greater, please contact your primary care physician to follow up on this.  _______________________________________________________  If you are age 44 or older, your body mass index should be between 23-30. Your Body mass index is 49.44 kg/m. If this is out of the aforementioned range listed, please consider follow up with your Primary Care Provider.  If you are age 61 or younger, your body mass index should be between 19-25. Your Body mass index is 49.44 kg/m. If this is out of the aformentioned range listed, please consider follow up with your Primary Care Provider.   __________________________________________________________  The [facility] providers would like to encourage you to use [facility] to communicate with providers for non-urgent requests or questions.  Due to long hold times on the telephone, sending your provider a message by [facility] may be a faster and more efficient way to get a response.  Please allow 48 business hours for a response.   Please remember that this is for non-urgent requests.     Thank you for choosing me and [facility].  Vito Cirigliano, D.O.

## 2023-01-26 ENCOUNTER — Emergency Department (HOSPITAL_COMMUNITY)
Admission: EM | Admit: 2023-01-26 | Discharge: 2023-01-26 | Disposition: A | Discharge status: Home / Self Care | Payer: BC Managed Care – PPO

## 2023-01-26 ENCOUNTER — Encounter (HOSPITAL_COMMUNITY): Payer: Self-pay

## 2023-01-26 ENCOUNTER — Other Ambulatory Visit: Payer: Self-pay

## 2023-01-26 ENCOUNTER — Emergency Department (HOSPITAL_COMMUNITY): Payer: BC Managed Care – PPO

## 2023-01-26 DIAGNOSIS — E039 Hypothyroidism, unspecified: Secondary | ICD-10-CM | POA: Insufficient documentation

## 2023-01-26 DIAGNOSIS — R109 Unspecified abdominal pain: Secondary | ICD-10-CM

## 2023-01-26 DIAGNOSIS — Z79890 Hormone replacement therapy (postmenopausal): Secondary | ICD-10-CM | POA: Insufficient documentation

## 2023-01-26 DIAGNOSIS — N200 Calculus of kidney: Secondary | ICD-10-CM | POA: Diagnosis not present

## 2023-01-26 DIAGNOSIS — R1032 Left lower quadrant pain: Secondary | ICD-10-CM | POA: Diagnosis present

## 2023-01-26 DIAGNOSIS — E119 Type 2 diabetes mellitus without complications: Secondary | ICD-10-CM | POA: Diagnosis not present

## 2023-01-26 DIAGNOSIS — D72829 Elevated white blood cell count, unspecified: Secondary | ICD-10-CM | POA: Insufficient documentation

## 2023-01-26 DIAGNOSIS — Z7984 Long term (current) use of oral hypoglycemic drugs: Secondary | ICD-10-CM | POA: Insufficient documentation

## 2023-01-26 LAB — URINALYSIS, ROUTINE W REFLEX MICROSCOPIC
Bilirubin Urine: NEGATIVE
Glucose, UA: NEGATIVE mg/dL
Ketones, ur: 5 mg/dL — AB
Leukocytes,Ua: NEGATIVE
Nitrite: NEGATIVE
Protein, ur: 100 mg/dL — AB
RBC / HPF: >50 RBC/hpf (ref 0–5)
Specific Gravity, Urine: 1.026 (ref 1.005–1.030)
pH: 5 (ref 5.0–8.0)

## 2023-01-26 LAB — COMPREHENSIVE METABOLIC PANEL
ALT: 36 U/L (ref 0–44)
AST: 33 U/L (ref 15–41)
Albumin: 4.5 g/dL (ref 3.5–5.0)
Alkaline Phosphatase: 81 U/L (ref 38–126)
Anion gap: 11 (ref 5–15)
BUN: 10 mg/dL (ref 6–20)
CO2: 24 mmol/L (ref 22–32)
Calcium: 10 mg/dL (ref 8.9–10.3)
Chloride: 102 mmol/L (ref 98–111)
Creatinine, Ser: 0.84 mg/dL (ref 0.44–1.00)
GFR, Estimated: >60 mL/min (ref >60)
Glucose, Bld: 126 mg/dL — ABNORMAL HIGH (ref 70–99)
Potassium: 4.3 mmol/L (ref 3.5–5.1)
Sodium: 137 mmol/L (ref 135–145)
Total Bilirubin: 1.7 mg/dL — ABNORMAL HIGH (ref <1.2)
Total Protein: 8.7 g/dL — ABNORMAL HIGH (ref 6.5–8.1)

## 2023-01-26 LAB — LIPASE, BLOOD: Lipase: 31 U/L (ref 11–51)

## 2023-01-26 LAB — CBC
HCT: 40 % (ref 36.0–46.0)
Hemoglobin: 13.8 g/dL (ref 12.0–15.0)
MCH: 29.4 pg (ref 26.0–34.0)
MCHC: 34.5 g/dL (ref 30.0–36.0)
MCV: 85.3 fL (ref 80.0–100.0)
Platelets: 381 10*3/uL (ref 150–400)
RBC: 4.69 MIL/uL (ref 3.87–5.11)
RDW: 12.4 % (ref 11.5–15.5)
WBC: 15.6 10*3/uL — ABNORMAL HIGH (ref 4.0–10.5)
nRBC: 0 % (ref 0.0–0.2)

## 2023-01-26 LAB — HCG, SERUM, QUALITATIVE: Preg, Serum: NEGATIVE

## 2023-01-26 MED ORDER — KETOROLAC TROMETHAMINE 15 MG/ML IJ SOLN
15.0000 mg | Freq: Once | INTRAMUSCULAR | Status: AC
  Administered 2023-01-26: 15 mg via INTRAVENOUS
  Filled 2023-01-26: qty 1

## 2023-01-26 MED ORDER — IOHEXOL 300 MG/ML  SOLN
100.0000 mL | Freq: Once | INTRAMUSCULAR | Status: AC | PRN
  Administered 2023-01-26: 100 mL via INTRAVENOUS

## 2023-01-26 NOTE — ED Notes (Signed)
PT is aware of new urine sample needed

## 2023-01-26 NOTE — Discharge Instructions (Addendum)
Thank you for allowing [place] to be a part of your care.  You were evaluated in the ED for flank pain.  Your CT scan shows you have a 4 mm kidney stone in the proximal left ureter with mild obstruction.  You did also have fatty infiltration of the liver and cholelithiasis without cholecystitis (inflammation of gallbladder).    Please schedule a follow up appointment with the urologist.   Take 800 mg of ibuprofen every 6 hours as needed for mild-moderate pain.  If you have severe or breakthrough pain, take your prescribed oxycodone.  A prescription for Zofran has also been sent in to take for nausea and/or vomiting.   Increase your water consumption to help flush your kidneys and bladder.   Return to the ED if you develop sudden worsening of your symptoms, have pain that is not improved despite medication, have uncontrollable vomiting, fever, or if you have new concerns.

## 2023-01-26 NOTE — ED Triage Notes (Signed)
Patient is here for evaluation of left sided abdominal pain that radiates to her left flank. Patient reports that her pain on the left side is from the upper quadrant down to the lower quadrant. Pt reports going to urgent care and received a shot of toradol and pain has subsided, but recommended coming to the ER to get imaging.

## 2023-01-26 NOTE — ED Provider Notes (Signed)
[place] EMERGENCY DEPARTMENT AT [facility] Provider Note   CSN: [id number] Arrival date & time: 01/26/23  1716     History  Chief Complaint  Patient presents with   Abdominal Pain   Flank Pain    Rose Vargas is a 42 y.o. female with past medical history significant for hypothyroidism, anxiety, diabetes presents to the ED from urgent care with possible kidney stone.  Patient began having left lower abdominal and left flank pain that began today around 1430.  She states it initially began as a 6/10, but by the time she made it to urgent care it was a 9/10.  She reports the pain was so severe it caused her to vomit.  Patient had hematuria but no evidence of infection at urgent care.  Her symptoms began after having a bowel movement.  She received a shot of Toradol at the UC which she states has significantly helped her pain.  She states she was diaphoretic prior to UC, but then began having chills.  No known fever.  Patient reports she has known kidney stones, but has never passed one.  Denies dysuria, constipation, diarrhea.         Home Medications Prior to Admission medications   Medication Sig Start Date End Date Taking? Authorizing Provider  Cholecalciferol (VITAMIN D3) 1000 UNITS CAPS Take 1 capsule by mouth daily.    [provider]  metFORMIN (GLUCOPHAGE-XR) 500 MG 24 hr tablet Take 500 mg by mouth daily. 08/17/22   [provider]  Multiple Vitamin (MULTIVITAMIN) tablet Take 1 tablet by mouth daily.    [provider]  SYNTHROID 75 MCG tablet Take 75 mcg by mouth every morning.    [provider]      Allergies    Bee pollen and Short ragweed pollen ext    Review of Systems   Review of Systems  Constitutional:  Positive for chills and diaphoresis. Negative for fever.  Gastrointestinal:  Positive for abdominal pain, nausea and vomiting. Negative for constipation and diarrhea.  Genitourinary:  Positive for flank pain and  hematuria. Negative for dysuria and frequency.    Physical Exam Updated Vital Signs BP 135/84   Pulse 76   Temp 98.9 F (37.2 C)   Resp 18   Ht 5' (1.524 m)   Wt 114.8 kg   SpO2 99%   BMI 49.43 kg/m  Physical Exam Vitals and nursing note reviewed.  Constitutional:      General: She is not in acute distress.    Appearance: Normal appearance. She is not ill-appearing or diaphoretic.  Cardiovascular:     Rate and Rhythm: Normal rate and regular rhythm.  Pulmonary:     Effort: Pulmonary effort is normal.  Abdominal:     General: Abdomen is flat.     Palpations: Abdomen is soft.     Tenderness: There is no abdominal tenderness. There is left CVA tenderness (mild). There is no right CVA tenderness, guarding or rebound.  Skin:    General: Skin is warm and dry.     Capillary Refill: Capillary refill takes less than 2 seconds.  Neurological:     Mental Status: She is alert. Mental status is at baseline.  Psychiatric:        Mood and Affect: Mood normal.        Behavior: Behavior normal.     ED Results / Procedures / Treatments   Labs (all labs ordered are listed, but only abnormal results are displayed)  Labs Reviewed  COMPREHENSIVE METABOLIC PANEL - Abnormal; Notable for the following components:      Result Value   Glucose, Bld 126 (*)    Total Protein 8.7 (*)    Total Bilirubin 1.7 (*)    All other components within normal limits  CBC - Abnormal; Notable for the following components:   WBC 15.6 (*)    All other components within normal limits  URINALYSIS, ROUTINE W REFLEX MICROSCOPIC - Abnormal; Notable for the following components:   Color, Urine AMBER (*)    APPearance CLOUDY (*)    Hgb urine dipstick LARGE (*)    Ketones, ur 5 (*)    Protein, ur 100 (*)    Bacteria, UA FEW (*)    All other components within normal limits  LIPASE, BLOOD  HCG, SERUM, QUALITATIVE    EKG None  Radiology CT ABDOMEN PELVIS W CONTRAST  Result Date: 01/26/2023 CLINICAL DATA:   Left lower quadrant abdominal pain radiating to the left flank. EXAM: CT ABDOMEN AND PELVIS WITH CONTRAST TECHNIQUE: Multidetector CT imaging of the abdomen and pelvis was performed using the standard protocol following bolus administration of intravenous contrast. RADIATION DOSE REDUCTION: This exam was performed according to the departmental dose-optimization program which includes automated exposure control, adjustment of the mA and/or kV according to patient size and/or use of iterative reconstruction technique. CONTRAST:  OMNIPAQUE IOHEXOL 300 MG/ML  SOLN COMPARISON:  07/04/2022 FINDINGS: Lower chest: Lung bases are clear. Hepatobiliary: Diffuse fatty infiltration of the liver. No focal liver lesions. Cholelithiasis with multiple stones. No inflammatory stranding around the gallbladder. No bile duct dilatation. Unchanged appearance since prior study. Pancreas: Unremarkable. No pancreatic ductal dilatation or surrounding inflammatory changes. Spleen: Normal in size without focal abnormality. Adrenals/Urinary Tract: No adrenal gland nodules. 4 mm stone in the proximal left ureter at the ureteropelvic junction with mild hydronephrosis. Nephrograms are symmetrical. No additional stones are demonstrated. Right kidney, right ureter, and the bladder are normal. Stomach/Bowel: Stomach is within normal limits. Appendix appears normal. No evidence of bowel wall thickening, distention, or inflammatory changes. Vascular/Lymphatic: No significant vascular findings are present. No enlarged abdominal or pelvic lymph nodes. Reproductive: Uterus and bilateral adnexa are unremarkable. Other: No abdominal wall hernia or abnormality. No abdominopelvic ascites. Musculoskeletal: No acute or significant osseous findings. IMPRESSION: 1. 4 mm stone in the proximal left ureter with mild proximal obstruction. 2. Diffuse fatty infiltration of the liver. 3. Cholelithiasis without additional evidence of acute cholecystitis.  Electronically Signed   By: Burman Nieves M.D.   On: 01/26/2023 22:55    Procedures Procedures    Medications Ordered in ED Medications  iohexol (OMNIPAQUE) 300 MG/ML solution 100 mL (100 mLs Intravenous Contrast Given 01/26/23 2215)  ketorolac (TORADOL) 15 MG/ML injection 15 mg (15 mg Intravenous Given 01/26/23 2329)    ED Course/ Medical Decision Making/ A&P                                 Medical Decision Making Amount and/or Complexity of Data Reviewed Labs: ordered. Radiology: ordered.   This patient presents to the ED with chief complaint(s) of left abdominal and flank pain.  The complaint involves an extensive differential diagnosis and also carries with it a high risk of complications and morbidity.    The differential diagnosis includes kidney stone, diverticulitis, UTI, pyelonephritis (less likely as patient's urine at UC did not show infection)   The initial plan  is to obtain labs, UA, CT imaging  Additional history obtained: Records reviewed  urgent care note from earlier today  Initial Assessment:   On exam, patient appears comfortable and is not in acute distress.  Abdomen is soft without tenderness or distension.  Left CVA with mild tenderness on percussion.  Vitals are stable and patient is afebrile.   Independent ECG/labs interpretation:  The following labs were independently interpreted:  CBC with leukocytosis, no anemia.  Metabolic panel without major electrolyte disturbance.  Renal function is normal.  Hepatic function also normal.   UA with large Hgb and few bacteria.  Negative leukocyte and nitrite.  Not indicative of infection.    Independent visualization and interpretation of imaging: I independently visualized the following imaging with scope of interpretation limited to determining acute life threatening conditions related to emergency care: CT renal study, which revealed 4 mm stone in the proximal left ureter with mild proximal obstruction.  There  is diffuse fatty infiltration of the liver and cholelithiasis without acute cholecystitis.   Treatment and Reassessment: Patient given additional dose of 15 mg Toradol to help with pain.  She states her pain is starting to slowly return.  She appears otherwise comfortable.  Patient also reports she urinated and did not see any blood in her urine this time.  Disposition:   Patient given follow up information with [facility].  She has seen urologist in the past and advised she may follow up with them if she chooses.  Advised patient to increase her water intake.  Patient provided with information regarding diet to help prevent kidney stones.  Advised patient to take ibuprofen 800 mg every 6 hours for mild-moderate pain.  Oxycodone prescribed for severe or breakthrough pain and Zofran prescribed for nausea/vomiting.   The patient has been appropriately medically screened and/or stabilized in the ED. I have low suspicion for any other emergent medical condition which would require further screening, evaluation or treatment in the ED or require inpatient management. At time of discharge the patient is hemodynamically stable and in no acute distress. I have discussed work-up results and diagnosis with patient and answered all questions. Patient is agreeable with discharge plan. We discussed strict return precautions for returning to the emergency department and they verbalized understanding.            Final Clinical Impression(s) / ED Diagnoses Final diagnoses:  Kidney stone  Left flank pain    Rx / DC Orders ED Discharge Orders     None         Lenard Simmer, PA-C 01/26/23 2337    Durwin Glaze, MD 01/27/23 [phone]

## 2023-01-27 ENCOUNTER — Telehealth (HOSPITAL_COMMUNITY): Payer: Self-pay | Admitting: Emergency Medicine

## 2023-01-27 MED ORDER — IBUPROFEN 600 MG PO TABS: 600.0000 mg | ORAL_TABLET | Freq: Four times a day (QID) | ORAL | 0 refills | Status: AC | PRN

## 2023-01-27 MED ORDER — OXYCODONE-ACETAMINOPHEN 5-325 MG PO TABS: 1.0000 | ORAL_TABLET | Freq: Three times a day (TID) | ORAL | 0 refills | Status: AC | PRN

## 2023-01-27 MED ORDER — ONDANSETRON 8 MG PO TBDP: 8.0000 mg | ORAL_TABLET | Freq: Three times a day (TID) | ORAL | 0 refills | Status: DC | PRN

## 2023-01-27 NOTE — Telephone Encounter (Signed)
Patient called in this morning indicating that she did not receive any prescriptions as promised.  I reviewed patient's records, the discharge summary clearly does indicate that patient should be getting pain medication, nausea medication for her kidney stone.  I will send her medications to [facility].

## 2023-02-20 ENCOUNTER — Institutional Professional Consult (permissible substitution): Payer: BC Managed Care – PPO | Admitting: Pulmonary Disease

## 2023-03-02 ENCOUNTER — Other Ambulatory Visit: Payer: Self-pay | Admitting: Obstetrics and Gynecology

## 2023-03-02 DIAGNOSIS — R928 Other abnormal and inconclusive findings on diagnostic imaging of breast: Secondary | ICD-10-CM

## 2023-03-15 ENCOUNTER — Encounter: Payer: Self-pay | Admitting: Gastroenterology

## 2023-03-21 ENCOUNTER — Ambulatory Visit
Admission: RE | Admit: 2023-03-21 | Discharge: 2023-03-21 | Disposition: A | Discharge status: Home / Self Care | Payer: 59 | Source: Ambulatory Visit | Attending: Obstetrics and Gynecology | Admitting: Obstetrics and Gynecology

## 2023-03-21 ENCOUNTER — Other Ambulatory Visit: Payer: Self-pay | Admitting: Obstetrics and Gynecology

## 2023-03-21 DIAGNOSIS — R928 Other abnormal and inconclusive findings on diagnostic imaging of breast: Secondary | ICD-10-CM

## 2023-03-21 DIAGNOSIS — N6320 Unspecified lump in the left breast, unspecified quadrant: Secondary | ICD-10-CM

## 2023-03-24 ENCOUNTER — Ambulatory Visit
Admission: RE | Admit: 2023-03-24 | Discharge: 2023-03-24 | Disposition: A | Discharge status: Home / Self Care | Payer: 59 | Source: Ambulatory Visit | Attending: Obstetrics and Gynecology | Admitting: Obstetrics and Gynecology

## 2023-03-24 DIAGNOSIS — N6320 Unspecified lump in the left breast, unspecified quadrant: Secondary | ICD-10-CM

## 2023-03-24 HISTORY — PX: BREAST BIOPSY: SHX20

## 2023-03-26 ENCOUNTER — Ambulatory Visit
Admission: RE | Admit: 2023-03-26 | Discharge: 2023-03-26 | Disposition: A | Discharge status: Home / Self Care | Payer: 59 | Source: Ambulatory Visit

## 2023-03-26 VITALS — BP 140/90 | HR 81 | Temp 98.4°F | Resp 17

## 2023-03-26 DIAGNOSIS — J101 Influenza due to other identified influenza virus with other respiratory manifestations: Secondary | ICD-10-CM

## 2023-03-26 LAB — POCT INFLUENZA A/B
Influenza A, POC: POSITIVE — AB
Influenza B, POC: NEGATIVE

## 2023-03-26 MED ORDER — ONDANSETRON 4 MG PO TBDP: 4.0000 mg | ORAL_TABLET | Freq: Three times a day (TID) | ORAL | 0 refills | Status: AC | PRN

## 2023-03-26 MED ORDER — PROMETHAZINE-DM 6.25-15 MG/5ML PO SYRP: 5.0000 mL | ORAL_SOLUTION | Freq: Every evening | ORAL | 0 refills | Status: AC | PRN

## 2023-03-26 MED ORDER — OSELTAMIVIR PHOSPHATE 75 MG PO CAPS: 75.0000 mg | ORAL_CAPSULE | Freq: Two times a day (BID) | ORAL | 0 refills | Status: AC

## 2023-03-26 NOTE — ED Triage Notes (Signed)
Pt c/o cough, headache, congestion, nausea and feeling cold for two days  Took Tylenol cold and flu at 4:45 this morning

## 2023-03-26 NOTE — Discharge Instructions (Signed)
 You have the flu. Take Tamiflu every 12 hours for the next 5 days to improve symptoms and stop the virus from replicating in your body. Ibuprofen/tylenol as needed for fevers and body aches. Mucinex as needed for nasal congestion. Zofran as needed for nausea/vomiting.  Promethazine DM at bedtime as needed for cough.   If you develop any new or worsening symptoms or if your symptoms do not start to improve, please return here or follow-up with your primary care provider. If your symptoms are severe, please go to the emergency room.

## 2023-03-26 NOTE — ED Provider Notes (Signed)
[facility]    CSN: [id number] Arrival date & time: 03/26/23  4098      History   Chief Complaint Chief Complaint  Patient presents with   Influenza    Entered by patient    HPI Rose [facility] is a 43 y.o. female.   [name] is a 43 y.o. female presenting for chief complaint of flu like symptoms with cough, congestion, sore throat, body aches, and intermittent nausea without vomiting tht started 48 hours ago. Cough is mostly dry and non-productive. Reports intermittent shortness of breath associated with coughing. She has had chills at home and is unsure of max temp at home. Reports intermittent nausea without vomiting or diarrhea. Denies CP, palpitations, dizziness, and ear pain. Denies history of chronic respiratory problems, never smoker. Taking OTC medications for symptoms with some relief.    Influenza   Past Medical History:  Diagnosis Date   Abnormal Pap smear of cervix 01/2021   Anxiety    Arthritis    DM (diabetes mellitus) (HCC)    Endometrial polyp    Gallstones    Headache    Heart murmur    benign, asymptomatic   Hypothyroidism     Patient Active Problem List   Diagnosis Date Noted   Chronic migraine 12/02/2015    Past Surgical History:  Procedure Laterality Date   BREAST BIOPSY Left 03/24/2023   MM LT BREAST BX W LOC DEV 1ST LESION IMAGE BX SPEC STEREO GUIDE 03/24/2023 [facility]   HYSTEROSCOPY WITH D & C N/A 04/30/2014   Procedure: DILATATION AND CURETTAGE /HYSTEROSCOPY;  Surgeon: Marcelle Overlie, MD;  Location: [facility] [place];  Service: Gynecology;  Laterality: N/A;   POLYPECTOMY     WISDOM TOOTH EXTRACTION  2006    OB History   No obstetric history on file.      Home Medications    Prior to Admission medications   Medication Sig Start Date End Date Taking? Authorizing Provider  amphetamine-dextroamphetamine (ADDERALL XR) 20 MG 24 hr capsule Take 20 mg by mouth daily. 01/20/23  Yes [provider]  desvenlafaxine (PRISTIQ) 25 MG 24 hr tablet Take 1 tablet by mouth daily. 12/09/22  Yes [provider]  ondansetron (ZOFRAN-ODT) 4 MG disintegrating tablet Take 1 tablet (4 mg total) by mouth every 8 (eight) hours as needed for nausea or vomiting. 03/26/23  Yes Carlisle Beers, FNP  oseltamivir (TAMIFLU) 75 MG capsule Take 1 capsule (75 mg total) by mouth every 12 (twelve) hours. 03/26/23  Yes Carlisle Beers, FNP  promethazine-dextromethorphan (PROMETHAZINE-DM) 6.25-15 MG/5ML syrup Take 5 mLs by mouth at bedtime as needed for cough. 03/26/23  Yes Carlisle Beers, FNP  Cholecalciferol (VITAMIN D3) 1000 UNITS CAPS Take 1 capsule by mouth daily.    [provider]  ibuprofen (ADVIL) 600 MG tablet Take 1 tablet (600 mg total) by mouth every 6 (six) hours as needed. 01/27/23   Derwood Kaplan, MD  metFORMIN (GLUCOPHAGE-XR) 500 MG 24 hr tablet Take 500 mg by mouth daily. Patient not taking: Reported on 03/26/2023 08/17/22   [provider]  Multiple Vitamin (MULTIVITAMIN) tablet Take 1 tablet by mouth daily.    [provider]  oxyCODONE-acetaminophen (PERCOCET/ROXICET) 5-325 MG tablet Take 1 tablet by mouth every 8 (eight) hours as needed for severe pain (pain score 7-10). Patient not taking: Reported on 03/26/2023 01/27/23   Derwood Kaplan, MD  SYNTHROID 75 MCG tablet Take 75 mcg by mouth every morning.    [provider]    Family History Family History  Problem Relation Age of Onset   Hyperthyroidism Mother    Hypertension Mother    Rheum arthritis Mother    Anemia Mother    Asthma Mother    Migraines Father    Hypercholesterolemia Father    Hypertension Father    Other Father        ? stroke or heart attack   Stomach cancer Maternal Grandmother    Cancer - Colon Maternal Grandfather 71   Prostate cancer Paternal Grandfather    Arthritis Brother     Social History Social History   Tobacco Use   Smoking status: Never    Smokeless tobacco: Never  Vaping Use   Vaping status: Never Used  Substance Use Topics   Alcohol use: Yes    Comment: social-1 per week   Drug use: No     Allergies   Bee pollen and Short ragweed pollen ext   Review of Systems Review of Systems Per HPI  Physical Exam Triage Vital Signs ED Triage Vitals  Encounter Vitals Group     BP 03/26/23 0836 (!) 140/90     Systolic BP Percentile --      Diastolic BP Percentile --      Pulse Rate 03/26/23 0836 81     Resp 03/26/23 0836 17     Temp 03/26/23 0836 98.4 F (36.9 C)     Temp Source 03/26/23 0836 Oral     SpO2 03/26/23 0836 94 %     Weight --      Height --      Head Circumference --      Peak Flow --      Pain Score 03/26/23 0838 7     Pain Loc --      Pain Education --      Exclude from Growth Chart --    No data found.  Updated Vital Signs BP (!) 140/90 (BP Location: Right Arm)   Pulse 81   Temp 98.4 F (36.9 C) (Oral)   Resp 17   LMP 03/21/2023 (Exact Date)   SpO2 94%   Visual Acuity Right Eye Distance:   Left Eye Distance:   Bilateral Distance:    Right Eye Near:   Left Eye Near:    Bilateral Near:     Physical Exam Vitals and nursing note reviewed.  Constitutional:      Appearance: She is ill-appearing. She is not toxic-appearing.  HENT:     Head: Normocephalic and atraumatic.     Right Ear: Hearing, tympanic membrane, ear canal and external ear normal.     Left Ear: Hearing, tympanic membrane, ear canal and external ear normal.     Nose: Congestion present.     Mouth/Throat:     Lips: Pink.     Mouth: Mucous membranes are moist. No injury or oral lesions.     Dentition: Normal dentition.     Tongue: No lesions.     Pharynx: Oropharynx is clear. Uvula midline. No pharyngeal swelling, oropharyngeal exudate, posterior oropharyngeal erythema, uvula swelling or postnasal drip.     Tonsils: No tonsillar exudate.  Eyes:     General: Lids are normal. Vision grossly intact. Gaze aligned  appropriately.     Extraocular Movements: Extraocular movements intact.     Conjunctiva/sclera: Conjunctivae normal.  Neck:     Trachea: Trachea and phonation normal.  Cardiovascular:     Rate and Rhythm: Normal rate and regular  rhythm.     Heart sounds: Normal heart sounds, S1 normal and S2 normal.  Pulmonary:     Effort: Pulmonary effort is normal. No respiratory distress.     Breath sounds: Normal air entry. No wheezing, rhonchi or rales.  Chest:     Chest wall: No tenderness.  Musculoskeletal:     Cervical back: Neck supple.  Lymphadenopathy:     Cervical: No cervical adenopathy.  Skin:    General: Skin is warm and dry.     Capillary Refill: Capillary refill takes less than 2 seconds.     Findings: No rash.  Neurological:     General: No focal deficit present.     Mental Status: She is alert and oriented to person, place, and time. Mental status is at baseline.     Cranial Nerves: No dysarthria or facial asymmetry.  Psychiatric:        Mood and Affect: Mood normal.        Speech: Speech normal.        Behavior: Behavior normal.        Thought Content: Thought content normal.        Judgment: Judgment normal.      UC Treatments / Results  Labs (all labs ordered are listed, but only abnormal results are displayed) Labs Reviewed  POCT INFLUENZA A/B - Abnormal; Notable for the following components:      Result Value   Influenza A, POC Positive (*)    All other components within normal limits    EKG   Radiology No results found.  Procedures Procedures (including critical care time)  Medications Ordered in UC Medications - No data to display  Initial Impression / Assessment and Plan / UC Course  I have reviewed the triage vital signs and the nursing notes.  Pertinent labs & imaging results that were available during my care of the patient were reviewed by me and considered in my medical decision making (see chart for details).   1. Influenza A Flu A point  of care testing positive. Lungs clear, vitals hemodynamically stable, therefore deferred imaging. Offered antiviral given timing of illness, Tamiflu sent to pharmacy.  Recommend supportive care for further symptomatic relief as outlined in AVS.  Modes of transmission, quarantine recommendations, and hand hygiene discussed.  Counseled patient on potential for adverse effects with medications prescribed/recommended today, strict ER and return-to-clinic precautions discussed, patient verbalized understanding.    Final Clinical Impressions(s) / UC Diagnoses   Final diagnoses:  Influenza A     Discharge Instructions      You have the flu. Take Tamiflu every 12 hours for the next 5 days to improve symptoms and stop the virus from replicating in your body. Ibuprofen/tylenol as needed for fevers and body aches. Mucinex as needed for nasal congestion. Zofran as needed for nausea/vomiting. Promethazine DM at bedtime as needed for cough.  If you develop any new or worsening symptoms or if your symptoms do not start to improve, please return here or follow-up with your primary care provider. If your symptoms are severe, please go to the emergency room.    ED Prescriptions     Medication Sig Dispense Auth. Provider   ondansetron (ZOFRAN-ODT) 4 MG disintegrating tablet Take 1 tablet (4 mg total) by mouth every 8 (eight) hours as needed for nausea or vomiting. 20 tablet Carlisle Beers, FNP   oseltamivir (TAMIFLU) 75 MG capsule Take 1 capsule (75 mg total) by mouth every 12 (twelve) hours. 10  capsule Carlisle Beers, FNP   promethazine-dextromethorphan (PROMETHAZINE-DM) 6.25-15 MG/5ML syrup Take 5 mLs by mouth at bedtime as needed for cough. 118 mL Carlisle Beers, FNP      PDMP not reviewed this encounter.   Carlisle Beers, [place] 03/26/23 [phone]

## 2023-03-28 LAB — SURGICAL PATHOLOGY
